# Patient Record
Sex: Female | Born: 1985 | Race: Black or African American | Hispanic: No | Marital: Single | Smoking: Former smoker
Health system: Southern US, Community
[De-identification: ages and names within clinical notes are randomized; demographics above are authoritative.]

## PROBLEM LIST (undated history)

## (undated) DIAGNOSIS — Z9289 Personal history of other medical treatment: Secondary | ICD-10-CM

## (undated) DIAGNOSIS — R5383 Other fatigue: Secondary | ICD-10-CM

## (undated) DIAGNOSIS — F329 Major depressive disorder, single episode, unspecified: Secondary | ICD-10-CM

## (undated) DIAGNOSIS — F419 Anxiety disorder, unspecified: Secondary | ICD-10-CM

## (undated) DIAGNOSIS — F32A Depression, unspecified: Secondary | ICD-10-CM

## (undated) HISTORY — DX: Anxiety disorder, unspecified: F41.9

## (undated) HISTORY — DX: Major depressive disorder, single episode, unspecified: F32.9

## (undated) HISTORY — DX: Depression, unspecified: F32.A

## (undated) HISTORY — PX: SPINAL FUSION: SHX223

## (undated) HISTORY — DX: Other fatigue: R53.83

---

## 2009-11-16 ENCOUNTER — Ambulatory Visit (HOSPITAL_COMMUNITY): Admission: RE | Admit: 2009-11-16 | Discharge: 2009-11-16 | Payer: Self-pay | Admitting: Psychiatry

## 2009-11-20 ENCOUNTER — Other Ambulatory Visit (HOSPITAL_COMMUNITY): Admission: RE | Admit: 2009-11-20 | Discharge: 2009-11-27 | Payer: Self-pay | Admitting: Psychiatry

## 2009-11-23 ENCOUNTER — Ambulatory Visit: Payer: Self-pay | Admitting: Psychiatry

## 2010-10-22 ENCOUNTER — Ambulatory Visit (HOSPITAL_COMMUNITY)
Admission: RE | Admit: 2010-10-22 | Discharge: 2010-10-22 | Disposition: A | Discharge status: Home / Self Care | Payer: BC Managed Care – PPO | Attending: Psychiatry | Admitting: Psychiatry

## 2010-10-22 ENCOUNTER — Ambulatory Visit (HOSPITAL_COMMUNITY): Admission: RE | Admit: 2010-10-22 | Payer: Self-pay | Source: Home / Self Care | Admitting: Psychiatry

## 2010-10-22 DIAGNOSIS — F331 Major depressive disorder, recurrent, moderate: Secondary | ICD-10-CM | POA: Insufficient documentation

## 2010-10-24 ENCOUNTER — Other Ambulatory Visit (HOSPITAL_COMMUNITY): Payer: BC Managed Care – PPO | Admitting: Psychiatry

## 2010-10-25 ENCOUNTER — Other Ambulatory Visit (HOSPITAL_COMMUNITY): Payer: BC Managed Care – PPO | Attending: Psychiatry | Admitting: Psychiatry

## 2010-10-25 DIAGNOSIS — F39 Unspecified mood [affective] disorder: Secondary | ICD-10-CM

## 2010-10-25 DIAGNOSIS — Z818 Family history of other mental and behavioral disorders: Secondary | ICD-10-CM | POA: Insufficient documentation

## 2010-10-25 DIAGNOSIS — F411 Generalized anxiety disorder: Secondary | ICD-10-CM | POA: Insufficient documentation

## 2010-10-26 ENCOUNTER — Other Ambulatory Visit (HOSPITAL_COMMUNITY): Payer: BC Managed Care – PPO | Admitting: Psychiatry

## 2010-10-29 ENCOUNTER — Other Ambulatory Visit (HOSPITAL_COMMUNITY): Payer: BC Managed Care – PPO | Admitting: Psychiatry

## 2010-10-30 ENCOUNTER — Other Ambulatory Visit (HOSPITAL_COMMUNITY): Payer: BC Managed Care – PPO | Admitting: Psychiatry

## 2010-10-31 ENCOUNTER — Other Ambulatory Visit (HOSPITAL_COMMUNITY): Payer: BC Managed Care – PPO | Admitting: Psychiatry

## 2010-11-01 ENCOUNTER — Other Ambulatory Visit (HOSPITAL_COMMUNITY): Payer: BC Managed Care – PPO | Admitting: Psychiatry

## 2010-11-02 ENCOUNTER — Other Ambulatory Visit (HOSPITAL_COMMUNITY): Payer: BC Managed Care – PPO | Admitting: Psychiatry

## 2010-11-05 ENCOUNTER — Other Ambulatory Visit (HOSPITAL_COMMUNITY): Payer: BC Managed Care – PPO | Admitting: Psychiatry

## 2010-11-06 ENCOUNTER — Other Ambulatory Visit (HOSPITAL_COMMUNITY): Payer: BC Managed Care – PPO | Admitting: Psychiatry

## 2010-11-07 ENCOUNTER — Other Ambulatory Visit (HOSPITAL_COMMUNITY): Payer: BC Managed Care – PPO | Admitting: Psychiatry

## 2010-11-08 ENCOUNTER — Other Ambulatory Visit (HOSPITAL_COMMUNITY): Payer: BC Managed Care – PPO | Admitting: Psychiatry

## 2010-11-09 ENCOUNTER — Other Ambulatory Visit (HOSPITAL_COMMUNITY): Payer: BC Managed Care – PPO | Admitting: Psychiatry

## 2010-11-12 ENCOUNTER — Other Ambulatory Visit (HOSPITAL_COMMUNITY): Payer: BC Managed Care – PPO | Admitting: Psychiatry

## 2010-11-13 ENCOUNTER — Other Ambulatory Visit (HOSPITAL_COMMUNITY): Payer: BC Managed Care – PPO | Admitting: Psychiatry

## 2011-07-26 ENCOUNTER — Encounter: Payer: Self-pay | Admitting: *Deleted

## 2011-07-26 ENCOUNTER — Emergency Department (HOSPITAL_COMMUNITY)
Admission: EM | Admit: 2011-07-26 | Discharge: 2011-07-26 | Disposition: A | Discharge status: Home / Self Care | Payer: No Typology Code available for payment source | Attending: Emergency Medicine | Admitting: Emergency Medicine

## 2011-07-26 ENCOUNTER — Emergency Department (HOSPITAL_COMMUNITY): Payer: No Typology Code available for payment source

## 2011-07-26 DIAGNOSIS — M546 Pain in thoracic spine: Secondary | ICD-10-CM | POA: Insufficient documentation

## 2011-07-26 DIAGNOSIS — M542 Cervicalgia: Secondary | ICD-10-CM | POA: Insufficient documentation

## 2011-07-26 DIAGNOSIS — M25519 Pain in unspecified shoulder: Secondary | ICD-10-CM | POA: Insufficient documentation

## 2011-07-26 DIAGNOSIS — Z981 Arthrodesis status: Secondary | ICD-10-CM | POA: Insufficient documentation

## 2011-07-26 DIAGNOSIS — M549 Dorsalgia, unspecified: Secondary | ICD-10-CM

## 2011-07-26 DIAGNOSIS — IMO0001 Reserved for inherently not codable concepts without codable children: Secondary | ICD-10-CM | POA: Insufficient documentation

## 2011-07-26 DIAGNOSIS — Z79899 Other long term (current) drug therapy: Secondary | ICD-10-CM | POA: Insufficient documentation

## 2011-07-26 DIAGNOSIS — M545 Low back pain, unspecified: Secondary | ICD-10-CM | POA: Insufficient documentation

## 2011-07-26 DIAGNOSIS — F17200 Nicotine dependence, unspecified, uncomplicated: Secondary | ICD-10-CM | POA: Insufficient documentation

## 2011-07-26 MED ORDER — OXYCODONE-ACETAMINOPHEN 5-325 MG PO TABS: 1.0000 | ORAL_TABLET | Freq: Once | ORAL | Status: AC

## 2011-07-26 MED ORDER — DIAZEPAM 5 MG PO TABS: 5.0000 mg | ORAL_TABLET | Freq: Once | ORAL | Status: AC

## 2011-07-26 MED ORDER — IBUPROFEN 600 MG PO TABS: 600.0000 mg | ORAL_TABLET | Freq: Once | ORAL | Status: AC

## 2011-07-26 MED ORDER — OXYCODONE-ACETAMINOPHEN 5-325 MG PO TABS
1.0000 | ORAL_TABLET | Freq: Once | ORAL | Status: AC
  Administered 2011-07-26: 1 via ORAL
  Filled 2011-07-26: qty 1

## 2011-07-26 MED ORDER — IBUPROFEN 200 MG PO TABS
600.0000 mg | ORAL_TABLET | Freq: Once | ORAL | Status: AC
  Administered 2011-07-26: 600 mg via ORAL
  Filled 2011-07-26: qty 3

## 2011-07-26 MED ORDER — DIAZEPAM 5 MG PO TABS
5.0000 mg | ORAL_TABLET | Freq: Once | ORAL | Status: AC
  Administered 2011-07-26: 5 mg via ORAL
  Filled 2011-07-26: qty 1
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
## 2011-07-26 NOTE — ED Provider Notes (Signed)
Medical screening examination/treatment/procedure(s) were performed by non-physician practitioner and as supervising physician I was immediately available for consultation/collaboration.  Pansey Pinheiro, MD 07/26/11 2300 

## 2011-08-08 ENCOUNTER — Emergency Department (HOSPITAL_COMMUNITY): Payer: No Typology Code available for payment source

## 2011-08-08 ENCOUNTER — Emergency Department (HOSPITAL_COMMUNITY)
Admission: EM | Admit: 2011-08-08 | Discharge: 2011-08-08 | Disposition: A | Discharge status: Home / Self Care | Payer: No Typology Code available for payment source | Attending: Emergency Medicine | Admitting: Emergency Medicine

## 2011-08-08 DIAGNOSIS — M25519 Pain in unspecified shoulder: Secondary | ICD-10-CM | POA: Insufficient documentation

## 2011-08-08 DIAGNOSIS — Z79899 Other long term (current) drug therapy: Secondary | ICD-10-CM | POA: Insufficient documentation

## 2011-08-08 DIAGNOSIS — R209 Unspecified disturbances of skin sensation: Secondary | ICD-10-CM | POA: Insufficient documentation

## 2011-08-08 DIAGNOSIS — F17200 Nicotine dependence, unspecified, uncomplicated: Secondary | ICD-10-CM | POA: Insufficient documentation

## 2011-08-08 DIAGNOSIS — IMO0001 Reserved for inherently not codable concepts without codable children: Secondary | ICD-10-CM | POA: Insufficient documentation

## 2011-08-08 DIAGNOSIS — M549 Dorsalgia, unspecified: Secondary | ICD-10-CM | POA: Insufficient documentation

## 2011-08-08 DIAGNOSIS — M62838 Other muscle spasm: Secondary | ICD-10-CM | POA: Insufficient documentation

## 2011-08-08 MED ORDER — HYDROCODONE-ACETAMINOPHEN 5-325 MG PO TABS: 1.0000 | ORAL_TABLET | Freq: Four times a day (QID) | ORAL | Status: AC | PRN

## 2011-08-08 MED ORDER — DIAZEPAM 5 MG PO TABS: 5.0000 mg | ORAL_TABLET | Freq: Two times a day (BID) | ORAL | Status: AC

## 2011-08-08 MED ORDER — IBUPROFEN 800 MG PO TABS: 800.0000 mg | ORAL_TABLET | Freq: Three times a day (TID) | ORAL | Status: AC
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
## 2011-08-08 NOTE — ED Provider Notes (Signed)
Medical screening examination/treatment/procedure(s) were performed by non-physician practitioner and as supervising physician I was immediately available for consultation/collaboration.   Laray Anger, DO 08/08/11 1948
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
## 2011-08-22 ENCOUNTER — Ambulatory Visit (HOSPITAL_COMMUNITY)
Admission: RE | Admit: 2011-08-22 | Discharge: 2011-08-22 | Disposition: A | Discharge status: Home / Self Care | Payer: BC Managed Care – PPO | Attending: Psychiatry | Admitting: Psychiatry

## 2011-08-22 DIAGNOSIS — F332 Major depressive disorder, recurrent severe without psychotic features: Secondary | ICD-10-CM | POA: Insufficient documentation

## 2011-08-22 DIAGNOSIS — F17200 Nicotine dependence, unspecified, uncomplicated: Secondary | ICD-10-CM | POA: Insufficient documentation

## 2011-08-22 NOTE — BH Assessment (Signed)
Assessment Note   Peggy Richardson is an 26 y.o. female who presents with symptoms of severe depression and anxiety.  Pt states that her sister woke her up last week and stated she no longer wanted to live and was ready to die.  Pt stated her sister did not attempt suicide but that she is up with her constantly and is always worried about her.  Pt states that due to her sisters condition the pt is constantly panic stricken and deeply depressed.  Pt suffers from at least 1 panic attack per day and states that her symptoms are interfering with her job and family life.  Pt is concerned that her sisters condition is tearing her family apart and this concern is contributing to her MH symptoms. Pt stated her sister refuses to seek help.  Pt states that she has been in Psych IOP before in April of 2012.  Pt states that psych IOP helped her a great deal.  Pt denies any SI and HI as well as A/V hallucinations and delusions.  Pt was able to contract for safety.  Pt referred to [facility].      Axis I: Major Depression, Recurrent severe Axis II: Deferred Axis III: No past medical history on file. Axis IV: housing problems, other psychosocial or environmental problems, problems related to social environment and problems with primary support group Axis V: 31-40 impairment in reality testing  Past Medical History: No past medical history on file.  Past Surgical History  Procedure Date  . Spinal fusion     Family History: No family history on file.  Social History:  reports that she has been smoking Cigarettes.  She does not have any smokeless tobacco history on file. She reports that she drinks alcohol. She reports that she does not use illicit drugs.  Additional Social History:    Allergies: No Known Allergies  Home Medications:  Medications Prior to Admission  Medication Sig Dispense Refill  . ALPRAZolam (XANAX) 1 MG tablet Take 1 mg by mouth 3 (three) times daily as needed. anxiety       . ARIPiprazole  (ABILIFY PO) Take 1 tablet by mouth daily.       . diazepam (VALIUM) 5 MG tablet Take 5 mg by mouth every 6 (six) hours as needed.      Marland Kitchen ibuprofen (ADVIL,MOTRIN) 600 MG tablet Take 600 mg by mouth every 6 (six) hours as needed. pain      . imipramine (TOFRANIL) 50 MG tablet Take 150 mg by mouth at bedtime.        Marland Kitchen oxyCODONE-acetaminophen (PERCOCET) 5-325 MG per tablet Take 1 tablet by mouth every 4 (four) hours as needed.       No current facility-administered medications on file as of 08/22/2011.    OB/GYN Status:  Patient's last menstrual period was 07/02/2011.  General Assessment Data Location of Assessment: [facility] Assessment Services Living Arrangements: Relatives (sister ) Can pt return to current living arrangement?: Yes Admission Status: Other (Comment) Is patient capable of signing voluntary admission?: Yes Transfer from: Home Referral Source: Self/Family/Friend     Risk to self Suicidal Ideation: No Suicidal Intent: No Is patient at risk for suicide?: No Suicidal Plan?: No Access to Means: No What has been your use of drugs/alcohol within the last 12 months?: none Previous Attempts/Gestures: No How many times?: 0  Other Self Harm Risks: used to cut self Triggers for Past Attempts: None known Intentional Self Injurious Behavior: Cutting Family Suicide History: No Recent stressful  life event(s): Turmoil (Comment);Trauma (Comment) (sister is suicidal and depressed) Persecutory voices/beliefs?: No Depression: Yes Depression Symptoms: Despondent;Insomnia;Tearfulness;Isolating;Guilt;Fatigue;Loss of interest in usual pleasures;Feeling worthless/self pity;Feeling angry/irritable Substance abuse history and/or treatment for substance abuse?: No Suicide prevention information given to non-admitted patients: Not applicable  Risk to Others Homicidal Ideation: No Thoughts of Harm to Others: No Current Homicidal Intent: No Current Homicidal Plan: No Access to Homicidal Means:  No Identified Victim: none History of harm to others?: No Assessment of Violence: None Noted Violent Behavior Description: used to cut herself Does patient have access to weapons?: No Criminal Charges Pending?: No Does patient have a court date: No  Psychosis Hallucinations: None noted Delusions: None noted  Mental Status Report Appear/Hygiene: Other (Comment) (unremarkable) Eye Contact: Good Motor Activity: Unremarkable Speech: Other (Comment) (unremarkable) Level of Consciousness: Alert Mood: Depressed;Despair;Sad Affect: Anxious;Sad;Depressed Anxiety Level: Panic Attacks Panic attack frequency: daily Most recent panic attack: yesterday Thought Processes: Coherent;Relevant Judgement: Unimpaired Orientation: Person;Place;Time;Situation Obsessive Compulsive Thoughts/Behaviors: Moderate  Cognitive Functioning Concentration: Decreased Memory: Recent Impaired;Remote Intact IQ: Average Insight: Fair Impulse Control: Good Appetite: Poor Weight Loss: 0  Weight Gain: 0  Sleep: Decreased Total Hours of Sleep: 4  Vegetative Symptoms: None  Prior Inpatient Therapy Prior Inpatient Therapy: No Prior Therapy Dates: none Prior Therapy Facilty/Provider(s): none Reason for Treatment: none  Prior Outpatient Therapy Prior Outpatient Therapy: Yes Prior Therapy Dates: april 2012 Prior Therapy Facilty/Provider(s): [facility] Reason for Treatment: depression and anxiety                     Additional Information 1:1 In Past 12 Months?: No CIRT Risk: No Elopement Risk: No Does patient have medical clearance?: No     Disposition:  Disposition Disposition of Patient: Outpatient treatment Type of outpatient treatment: Psych Intensive Outpatient  On Site Evaluation by:   Reviewed with Physician:     Ena Dawley Pate 08/22/2011 12:04 PM

## 2011-08-26 ENCOUNTER — Encounter (HOSPITAL_COMMUNITY): Payer: Self-pay

## 2011-08-26 ENCOUNTER — Other Ambulatory Visit (HOSPITAL_COMMUNITY): Payer: BC Managed Care – PPO | Attending: Psychiatry

## 2011-08-26 DIAGNOSIS — F332 Major depressive disorder, recurrent severe without psychotic features: Secondary | ICD-10-CM | POA: Insufficient documentation

## 2011-08-26 DIAGNOSIS — Z818 Family history of other mental and behavioral disorders: Secondary | ICD-10-CM | POA: Insufficient documentation

## 2011-08-26 DIAGNOSIS — F411 Generalized anxiety disorder: Secondary | ICD-10-CM | POA: Insufficient documentation

## 2011-08-26 DIAGNOSIS — Z79899 Other long term (current) drug therapy: Secondary | ICD-10-CM | POA: Insufficient documentation

## 2011-08-26 DIAGNOSIS — F339 Major depressive disorder, recurrent, unspecified: Secondary | ICD-10-CM

## 2011-08-26 DIAGNOSIS — F17200 Nicotine dependence, unspecified, uncomplicated: Secondary | ICD-10-CM | POA: Insufficient documentation

## 2011-08-26 NOTE — Progress Notes (Signed)
   Daily Group Progress Note  Program: IOP  Group Time: 0900 - 1045  Participation Level: Active  Behavioral Response: Appropriate, Sharing and Motivated  Type of Therapy:  Process Group  Summary of Progress: New to group, but had been in IOP in the past, so we did introductions and little other orientation needed.  Participated in discussion of what constituted "working through" problems in therapy.  She was most appropriate and although blunted in affect, contributed to the group.    Group Time: 1100 - 1200  Participation Level:    See paper progress notes  Behavioral Response: See paper progress notes  Type of Therapy:  See paper progress notes  Summary of Progress:  Shonna Chock, APRN, CNS  [facility]
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
## 2011-08-26 NOTE — Progress Notes (Signed)
     Daily Group Progress Note  Program: IOP  Group Time: 0900 - 1045  Participation Level: Active  Behavioral Response: Appropriate, Sharing and Motivated  Type of Therapy:  Process Group  Summary of Progress: New to group near end of this group.  Began to contribute with discussion related to what it means to "work through" problems in therapy.  Themes of group fit nicely with her symptoms as all are dealing with anxiety and depression.    Group Time: 1100 - 1200  Participation Level:    See paper progress notes  Behavioral Response: See paper progress notes  Type of Therapy:  See paper progress notes  Summary of Progress:  Marsella Suman, APRN, CNS  [facility] 

## 2011-08-26 NOTE — Progress Notes (Signed)
     Daily Group Progress Note  Program: IOP  Group Time: 0900 - 1045  Participation Level: Active  Behavioral Response: Appropriate, Sharing and Motivated  Type of Therapy:  Process Group  Summary of Progress: New to group near end of this group.  Began to contribute with discussion related to what it means to "work through" problems in therapy.  Themes of group fit nicely with her symptoms as all are dealing with anxiety and depression.    Group Time: 1100 - 1200  Participation Level:    See paper progress notes  Behavioral Response: See paper progress notes  Type of Therapy:  See paper progress notes  Summary of Progress:  Shonna Chock, APRN, CNS  [facility]

## 2011-08-26 NOTE — Progress Notes (Unsigned)
Psychiatric Assessment Adult  Patient Identification:  Demari Kropp Date of Evaluation:  08/26/2011 Chief Complaint: *** History of Chief Complaint:   Chief Complaint  Patient presents with  . Anxiety  . Depression  . Stress    HPI Review of Systems Physical Exam  Depressive Symptoms: {DEPRESSION SYMPTOMS:20000}  (Hypo) Manic Symptoms:   Elevated Mood:  {BHH YES OR NO:22294} Irritable Mood:  {BHH YES OR NO:22294} Grandiosity:  {BHH YES OR NO:22294} Distractibility:  {BHH YES OR NO:22294} Labiality of Mood:  {BHH YES OR NO:22294} Delusions:  {BHH YES OR NO:22294} Hallucinations:  {BHH YES OR NO:22294} Impulsivity:  {BHH YES OR NO:22294} Sexually Inappropriate Behavior:  {BHH YES OR NO:22294} Financial Extravagance:  {BHH YES OR NO:22294} Flight of Ideas:  {BHH YES OR NO:22294}  Anxiety Symptoms: Excessive Worry:  {BHH YES OR NO:22294} Panic Symptoms:  {BHH YES OR NO:22294} Agoraphobia:  {BHH YES OR NO:22294} Obsessive Compulsive: {BHH YES OR NO:22294}  Symptoms: {Obsessive Compulsive Symptoms:22671} Specific Phobias:  {BHH YES OR NO:22294} Social Anxiety:  {BHH YES OR NO:22294}  Psychotic Symptoms:  Hallucinations: {BHH YES OR NO:22294} {Hallucinations:22672} Delusions:  {BHH YES OR NO:22294} Paranoia:  {BHH YES OR NO:22294}   Ideas of Reference:  {BHH YES OR NO:22294}  PTSD Symptoms: Ever had a traumatic exposure:  {BHH YES OR NO:22294} Had a traumatic exposure in the last month:  {BHH YES OR NO:22294} Re-experiencing: {BHH YES OR NO:22294} {Re-experiencing:22673} Hypervigilance:  {BHH YES OR NO:22294} Hyperarousal: {BHH YES OR NO:22294} {Hyperarousal:22674} Avoidance: {BHH YES OR NO:22294} {Avoidance:22675}  Traumatic Brain Injury: {BHH YES OR NO:22294} {Traumatic Brain Injury:22676}  Past Psychiatric History: Diagnosis: ***  Hospitalizations: ***  Outpatient Care: ***  Substance Abuse Care: ***  Self-Mutilation: ***  Suicidal Attempts: ***  Violent  Behaviors: ***   Past Medical History:   Past Medical History  Diagnosis Date  . Anxiety   . Depression   . Fatigue    History of Loss of Consciousness:  {BHH YES OR NO:22294} Seizure History:  {BHH YES OR NO:22294} Cardiac History:  {BHH YES OR NO:22294} Allergies:  No Known Allergies Current Medications:  Current Outpatient Prescriptions  Medication Sig Dispense Refill  . ALPRAZolam (XANAX) 1 MG tablet Take 1 mg by mouth 3 (three) times daily as needed. anxiety       . ARIPiprazole (ABILIFY PO) Take 1 tablet by mouth daily.       Marland Kitchen imipramine (TOFRANIL) 50 MG tablet Take 150 mg by mouth at bedtime.        . diazepam (VALIUM) 5 MG tablet Take 5 mg by mouth every 6 (six) hours as needed.      Marland Kitchen ibuprofen (ADVIL,MOTRIN) 600 MG tablet Take 600 mg by mouth every 6 (six) hours as needed. pain      . oxyCODONE-acetaminophen (PERCOCET) 5-325 MG per tablet Take 1 tablet by mouth every 4 (four) hours as needed.        Previous Psychotropic Medications:  Medication Dose   ***  ***                     Substance Abuse History in the last 12 months: Substance Age of 1st Use Last Use Amount Specific Type  Nicotine  ***  ***  ***  ***  Alcohol  ***  ***  ***  ***  Cannabis  ***  ***  ***  ***  Opiates  ***  ***  ***  ***  Cocaine  ***  ***  ***  ***  Methamphetamines  ***  ***  ***  ***  LSD  ***  ***  ***  ***  Ecstasy  ***   ***  ***  ***  Benzodiazepines  ***  ***  ***  ***  Caffeine  ***  ***  ***  ***  Inhalants  ***  ***  ***  ***  Others:                          Medical Consequences of Substance Abuse: ***  Legal Consequences of Substance Abuse: ***  Family Consequences of Substance Abuse: ***  Blackouts:  {BHH YES OR NO:22294} DT's:  {BHH YES OR NO:22294} Withdrawal Symptoms:  {BHH YES OR NO:22294} {Withdrawal Symptoms:22677}  Social History: Current Place of Residence: *** Place of Birth: *** Family Members: *** Marital Status:  {Marital  Status:22678} Children: ***  Sons: ***  Daughters: *** Relationships: *** Education:  {Education:22679} Educational Problems/Performance: *** Religious Beliefs/Practices: *** History of Abuse: {Desc; abuse:16542} Occupational Experiences; Military History:  {Military History:22680} Legal History: *** Hobbies/Interests: ***  Family History:   Family History  Problem Relation Age of Onset  . Depression Sister     Mental Status Examination/Evaluation: Objective:  Appearance: {Appearance:22683}  Eye Contact::  {BHH EYE CONTACT:22684}  Speech:  {Speech:22685}  Volume:  {Volume (PAA):22686}  Mood:  ***  Affect:  {Affect (PAA):22687}  Thought Process:  {Thought Process (PAA):22688}  Orientation:  {BHH ORIENTATION (PAA):22689}  Thought Content:  {Thought Content:22690}  Suicidal Thoughts:  {ST/HT (PAA):22692}  Homicidal Thoughts:  {ST/HT (PAA):22692}  Judgement:  {Judgement (PAA):22694}  Insight:  {Insight (PAA):22695}  Psychomotor Activity:  {Psychomotor (PAA):22696}  Akathisia:  {BHH YES OR NO:22294}  Handed:  {Handed:22697}  AIMS (if indicated):  ***  Assets:  {Assets (PAA):22698}    Laboratory/X-Ray Psychological Evaluation(s)   ***  ***   Assessment:  {axis diagnosis:3049000}  AXIS I {psych axis 1:31909}  AXIS II {psych axis 2:31910}  AXIS III Past Medical History  Diagnosis Date  . Anxiety   . Depression   . Fatigue      AXIS IV {psych axis iv:31915}  AXIS V {psych axis v score:31919}   Treatment Plan/Recommendations:  Plan of Care: ***  Laboratory:  {Laboratory:22682}  Psychotherapy: ***  Medications: ***  Routine PRN Medications:  {BHH YES OR NO:22294}  Consultations: ***  Safety Concerns:  ***  Other:      [facility] 2/4/20131:59 PM

## 2011-08-27 ENCOUNTER — Telehealth (HOSPITAL_COMMUNITY): Payer: Self-pay | Admitting: Psychiatry

## 2011-08-27 ENCOUNTER — Other Ambulatory Visit (HOSPITAL_COMMUNITY): Payer: BC Managed Care – PPO

## 2011-08-27 NOTE — Progress Notes (Unsigned)
    Daily Group Progress Note  Program: IOP  Group Time:  9:00 - 10:30 a.m.  Participation Level: None  Behavioral Response:   Type of Therapy:  Process Group  Summary of Progress:  Patient cancelled group today.  Group Time:  11:00 - Noon  Participation Level:    Behavioral Response:   Type of Therapy: Psycho-education Group  Summary of Progress:  Patient participated in a group on types of communication including assertive communication.  Was not present.  Cleophas Dunker, LMFT, CTS

## 2011-08-28 ENCOUNTER — Other Ambulatory Visit (HOSPITAL_COMMUNITY): Payer: BC Managed Care – PPO

## 2011-08-28 ENCOUNTER — Telehealth (HOSPITAL_COMMUNITY): Payer: Self-pay | Admitting: Psychiatry

## 2011-08-28 NOTE — Progress Notes (Unsigned)
    Daily Group Progress Note  Program: IOP  Group Time:  9:00 - 10:30 a.m.  Participation Level: {CHL AMB BH Group Participation:21022742}  Behavioral Response: {CHL AMB BH Group Behavior:21022743}  Type of Therapy:  Process Group  Summary of Progress:  Patient called stating she could not get her car started.  Group Time:  11:00 - Noon  Participation Level:  {CHL AMB BH Group Participation:21022742}  Behavioral Response: {CHL AMB BH Group Behavior:21022743}  Type of Therapy: Psycho-education Group  Summary of Progress:    Cleophas Dunker, LMFT, CTS

## 2011-08-28 NOTE — Progress Notes (Unsigned)
Psychiatric Assessment Adult  Patient Identification:  Awanda Mink Date of Evaluation:  08-26-11 Chief Complaint: Depression and anxiety History of Chief Complaint:  26 year old single African American female admitted because of depression. She lives with her sister her aunt this has been very stressful as sister has been depressed and has expressed suicidal ideation. Patient also works for city [employer] and worked as very stressful.  Chief Complaint  Patient presents with  . Depression    HPI Review of Systems Physical Exam  Depressive Symptoms: depressed mood, anhedonia, insomnia, psychomotor retardation, fatigue, feelings of worthlessness/guilt, difficulty concentrating, hopelessness, impaired memory, anxiety,  (Hypo) Manic Symptoms:   Elevated Mood:  No Irritable Mood:  No Grandiosity:  No Distractibility:  Yes Labiality of Mood:  No Delusions:  No Hallucinations:  No Impulsivity:  No Sexually Inappropriate Behavior:  No Financial Extravagance:  No Flight of Ideas:  No  Anxiety Symptoms: Excessive Worry:  Yes Panic Symptoms:  No Agoraphobia:  No Obsessive Compulsive: No  Symptoms: None, Specific Phobias:  No Social Anxiety:  Yes  Psychotic Symptoms:  Hallucinations: No None Delusions:  No Paranoia:  No    Ideas of Reference:  No  PTSD Symptoms: Ever had a traumatic exposure:  No Had a traumatic exposure in the last month:  No Re-experiencing: No None Hypervigilance:  No Hyperarousal: No None Avoidance: No None  Traumatic Brain Injury: No none  Past Psychiatric History: Diagnosis: Depression   Hospitalizations: [facility] in April of 2012   Outpatient Care: Dr. Evelene Croon  Substance Abuse Care: None   Self-Mutilation: History of cutting   Suicidal Attempts: None   Violent Behaviors: None    Past Medical History:   Past Medical History  Diagnosis Date  . Anxiety   . Depression   . Fatigue    History of Loss of Consciousness:  No Seizure  History:  No Cardiac History:  No Allergies:  No Known Allergies Current Medications:  Current Outpatient Prescriptions  Medication Sig Dispense Refill  . ALPRAZolam (XANAX) 1 MG tablet Take 1 mg by mouth 3 (three) times daily as needed. anxiety       . ARIPiprazole (ABILIFY PO) Take 1 tablet by mouth daily.       . diazepam (VALIUM) 5 MG tablet Take 5 mg by mouth every 6 (six) hours as needed.      Marland Kitchen ibuprofen (ADVIL,MOTRIN) 600 MG tablet Take 600 mg by mouth every 6 (six) hours as needed. pain      . imipramine (TOFRANIL) 50 MG tablet Take 150 mg by mouth at bedtime.        Marland Kitchen oxyCODONE-acetaminophen (PERCOCET) 5-325 MG per tablet Take 1 tablet by mouth every 4 (four) hours as needed.        Previous Psychotropic Medications:  Medication Dose                          Substance Abuse History in the last 12 months: Substance Age of 1st Use Last Use Amount Specific Type  Nicotine  teenager   today 4 cigarettes per day     Alcohol  teenager   uses it socially last use unknown     Cannabis      Opiates      Cocaine      Methamphetamines      LSD      Ecstasy      Benzodiazepines      Caffeine  Inhalants      Others:                          Medical Consequences of Substance Abuse:   Legal Consequences of Substance Abuse:   Family Consequences of Substance Abuse:   Blackouts:  No DT's:  No Withdrawal Symptoms:  No None  Social History: Current Place of Residence[place] Birth: Family Members:  Marital Status:  Single Children:   Sons:   Daughters:  Relationships:  Education:  HS Print production planner Problems/Performance: Religious Beliefs/Practices:  History of Abuse: none Teacher, music History:  None Legal History: None Hobbies/Interests:   Family History:   Family History  Problem Relation Age of Onset  . Depression Sister     Mental Status Examination/Evaluation: Objective:  Appearance: Casual obese  young lady   Eye Contact::  Good  Speech:  Clear and Coherent  Volume:  Normal  Mood:  Depressed anxious   Affect:  Appropriate  Thought Process:  Logical  Orientation:  Full  Thought Content:  WDL  Suicidal Thoughts:  No  Homicidal Thoughts:  No  Judgement:  Fair  Insight:  Fair  Psychomotor Activity:  Normal  Akathisia:  No  Handed:  Right  AIMS (if indicated):    Assets:  Communication Skills Desire for Improvement Physical Health Resilience    Laboratory/X-Ray Psychological Evaluation(s)        Assessment:  Axis I: Major Depression, Recurrent severe                                    AXIS I Anxiety Disorder NOS  AXIS II Deferred  AXIS III Past Medical History  Diagnosis Date  . Anxiety   . Depression   . Fatigue      AXIS IV economic problems, other psychosocial or environmental problems, problems related to social environment and problems with primary support group  AXIS V 51-60 moderate symptoms   Treatment Plan/Recommendations:  Plan of Care: Start IOP   Laboratory:  None  Psychotherapy: Individual, group   Medications: Continue home medications of imipramine 150 mg by mouth daily at bedtime #2 Xanax 1 mg at bedtime, #3 Abilify 5 mg by mouth Q. at bedtime   Routine PRN Medications:  Yes  Consultations: None   Safety Concerns:  None   Other:    Zakery Normington  [facility] 2/6/201310:10 AM

## 2011-08-29 ENCOUNTER — Other Ambulatory Visit (HOSPITAL_COMMUNITY): Payer: BC Managed Care – PPO

## 2011-08-29 NOTE — Progress Notes (Unsigned)
    Daily Group Progress Note  Program: {CHL AMB BH IOP/CDIOP Program Type:21022744}  Group Time:0900-1030   Participation Level: Active  Behavioral Response: Sharing, Appropriate  Type of Therapy:  Process Group  Summary of Progress: ***per Cleophas Dunker, LMFT, pt shared with the group why she was here in [facility]-IOP again.  Mentioned stressors of job, finances and sister's illness.     Group Time:   Participation Level:  {CHL AMB BH Group Participation:21022742}  Behavioral Response: {CHL AMB BH Group Behavior:21022743}  Type of Therapy: {CHL AMB BH Type of Therapy:21022741}  Summary of Progress: ***  [facility]

## 2011-08-30 ENCOUNTER — Other Ambulatory Visit (HOSPITAL_COMMUNITY): Payer: BC Managed Care – PPO

## 2011-08-30 DIAGNOSIS — F331 Major depressive disorder, recurrent, moderate: Secondary | ICD-10-CM

## 2011-08-30 NOTE — Progress Notes (Signed)
    Daily Group Progress Note  Program: IOP  Group Time: 0900 - 1030  Participation Level: Active  Behavioral Response: Appropriate, Sharing and Motivated  Type of Therapy:  Process Group  Summary of Progress: Leatrice reports she is feeling good and strong today.  She talked about how assertive she was with her sister and mother, which resulted in plan for sister to return home today to stay for awhile.  Annastyn will drive her and spend the weekend, but says she is going to reclaim her living space and refuse to have sister live with her again.  She confided that she was having the same problem a year ago when she attended IOP for the first time, but only now got the courage to speak up for herself.  She then participated much more spontaneously in the group and received a lot of positive feedback.     Group Time: 1045 - 1200  Participation Level:  Active  Behavioral Response: Appropriate, Sharing and Motivated  Type of Therapy: Psycho-education Group  Summary of Progress:  In preparing for the coming weekend, we did additional work on coping with anxiety.  In addition, we began preparing for discharge by looking at the therapeutic factors of being in this group.  Then we talked about ways to find some of those same factors elsewhere in out lives.  Some suggestions included support groups, classes or groups related to a topic of interest, making more friends who share our interests, and giving up the idea that everyone else is doing great and beginning to open up to more people.  Shonna Chock, APRN, CNS    [facility]

## 2011-09-02 ENCOUNTER — Other Ambulatory Visit (HOSPITAL_COMMUNITY): Payer: BC Managed Care – PPO

## 2011-09-02 DIAGNOSIS — F332 Major depressive disorder, recurrent severe without psychotic features: Secondary | ICD-10-CM

## 2011-09-02 NOTE — Progress Notes (Signed)
  Daily Group Progress Note  Program: IOP  Group Time: 0900 - 1030  Participation Level: Active  Behavioral Response: Appropriate, Sharing and Motivated  Type of Therapy:  Process Group  Summary of Progress: Peggy Richardson demonstrated low energy, and reported she drove back here from [place] (5 hours) just this morning in time for group.  She did take her sister home to their mother's and leave her there, but is already missing her.  She processed her plans related to reclaiming her apartment for herself and working toward return to college in order to prepare for a different job.  She recognized her ambivalence and clarified the reasons it was difficult to have the financial responsibility for her sister when she can barely support herself.  She is also considering quitting her job and moving back home, but is giving this another month before deciding.  Group Time: 1045 - 1200  Participation Level:  Active  Behavioral Response: Appropriate, Sharing and Motivated  Type of Therapy: Psycho-education Group  Summary of Progress: Grief and Loss group led by Theda Belfast, hospital Chaplain  Explores variety of losses and coping skills for dealing with these.    Shonna Chock, APRN, CNS  [facility]

## 2011-09-03 ENCOUNTER — Other Ambulatory Visit (HOSPITAL_COMMUNITY): Payer: BC Managed Care – PPO

## 2011-09-03 NOTE — Progress Notes (Unsigned)
    Daily Group Progress Note  Program: IOP  Group Time: 9:00-10:30 am   Participation Level: Active  Behavioral Response: Appropriate, Sharing, Motivated and Assertive  Type of Therapy:  Process Group  Summary of Progress: Patient reports improved mood and reduced depression as she feels good about her decision to have her sister move out of her home and is learning how to set better boundaries with family members.      Group Time: 10:30 am - 12:00 pm   Participation Level:  Active  Behavioral Response: Appropriate, Sharing, Motivated and Assertive  Type of Therapy: Psycho-education Group  Summary of Progress: Patient was educated on the purpose, goals and expectations of the group therapy session. Patient shared her goals for therapy as wanting to increase hers self-esteem and learn new coping skills to manage depression.   Maxcine Ham, MSW, LCSW

## 2011-09-04 ENCOUNTER — Other Ambulatory Visit (HOSPITAL_COMMUNITY): Payer: BC Managed Care – PPO

## 2011-09-04 NOTE — Progress Notes (Unsigned)
    Daily Group Progress Note  Program: IOP  Group Time: 9:00-10:30 am   Participation Level: Active  Behavioral Response: Appropriate, Sharing, Motivated and Assertive  Type of Therapy:  Process Group  Summary of Progress: Patient reports continued depression. She described her challenge with having good self-esteem and how her tendency to take care of others causes chaos in her own life and how she is trying to change and make herself more of a priority. Patient described how she is still trying to practice healthy boundary setting with others.      Group Time: 10:30 am - 12:00 pm   Participation Level:  Active  Behavioral Response: Appropriate, Sharing, Motivated and Assertive  Type of Therapy: Psycho-education Group  Summary of Progress: ***Patient participated in a discussion on defining depression and Bipolar Disorder and identified symptoms associated with each diganosis. Patient identified personal symptoms of depression and how to recognize them going forward.  Maxcine Ham, MSW, LCSW

## 2011-09-05 ENCOUNTER — Other Ambulatory Visit (HOSPITAL_COMMUNITY): Payer: BC Managed Care – PPO

## 2011-09-06 ENCOUNTER — Other Ambulatory Visit (HOSPITAL_COMMUNITY): Payer: BC Managed Care – PPO

## 2011-09-06 NOTE — Progress Notes (Signed)
    Daily Group Progress Note  Program: IOP  Group Time: 9:00-10:30 am   Participation Level: Active  Behavioral Response: Appropriate, Sharing, Motivated and Assertive  Type of Therapy:  Process Group  Summary of Progress: Patient reports mixed emotions of improved mood and reduced depression, but increased anxiety from the financial stress in her life. Patient described the difficulty she is having paying her rent, bills and buying food while she is waiting for her check to come from her employer. Patient received support from members and was informed of ways she could access financial support in the meantime.      Group Time: 10:30 am - 12:00 pm   Participation Level:  Active  Behavioral Response: Appropriate, Sharing, Motivated and Assertive  Type of Therapy: Psycho-education Group  Summary of Progress: Patient participated in a discussion on learning self-care strategies and created a personalized list of tools she can pull from to manage stress and ensure daily self care.   Maxcine Ham, MSW, LCSW

## 2011-09-09 ENCOUNTER — Other Ambulatory Visit (HOSPITAL_COMMUNITY): Payer: BC Managed Care – PPO

## 2011-09-09 NOTE — Progress Notes (Signed)
    Daily Group Progress Note  Program: IOP  Group Time: 9:00-10:30 am   Participation Level: Active  Behavioral Response: Appropriate, Sharing, Motivated and Assertive  Type of Therapy:  Process Group  Summary of Progress: Patient reports continued improved mood, self-esteem and ability to set healthy boundaries for self. Patient described how she learning the importance of putting her needs before others in order to be healthy. Patient shared how she is learning from a previous romantic breakup the importance of not letting others take priority over self. Patient shared how she learned to be more confident in herself from this experience.      Group Time: 10:30 am - 12:00 pm   Participation Level:  Active  Behavioral Response: Appropriate  Type of Therapy: Process Group  Summary of Progress: Patient participated in a group facilitated by the hospital chaplin on grief and loss and identified current losses and identified feelings and barriers associated with grieving the loss.  Maxcine Ham, MSW, LCSW

## 2011-09-10 ENCOUNTER — Other Ambulatory Visit (HOSPITAL_COMMUNITY): Payer: BC Managed Care – PPO

## 2011-09-10 NOTE — Progress Notes (Signed)
    Daily Group Progress Note  Program: IOP  Group Time: 9:00-10:30 am   Participation Level: Active  Behavioral Response: Appropriate  Type of Therapy:  Process Group  Summary of Progress: Patient report continued stable mood and progress with self-esteem and boundary setting. Patient is scheduled to end group this Friday and states she is hoping to continue learning emotional management tools.     Group Time: 10:30 am - 12:00 pm   Participation Level:  Active  Behavioral Response: Appropriate  Type of Therapy: Psycho-education Group  Summary of Progress: Patient participated in a discussion and education segment on self-esteem, identifying factors of low self esteem and was assigned homework on how to identify positive traits in self to combat negative beliefs.   Maxcine Ham, MSW, LCSW

## 2011-09-11 ENCOUNTER — Other Ambulatory Visit (HOSPITAL_COMMUNITY): Payer: BC Managed Care – PPO

## 2011-09-11 NOTE — Progress Notes (Signed)
    Daily Group Progress Note  Program: IOP  Group Time: 9:00-10:30 am   Participation Level: Active  Behavioral Response: Appropriate  Type of Therapy:  Process Group  Summary of Progress: patient discussed triggers to depression symptoms and provided personal examples that contribute to mood instability. Patient received support from others experiencing similar triggers.      Group Time: 10:30 am - 12:00 pm   Participation Level:  Active  Behavioral Response: Appropriate  Type of Therapy: Psycho-education Group  Summary of Progress: Patient participated in an education segment on identifying triggers and creating a plan to reduce depression and anxiety symptoms in the presence of these triggers.   Jheri Mitter, MSW, LCSW  

## 2011-09-12 ENCOUNTER — Other Ambulatory Visit (HOSPITAL_COMMUNITY): Payer: BC Managed Care – PPO

## 2011-09-12 NOTE — Progress Notes (Signed)
    Daily Group Progress Note  Program: IOP  Group Time: 9:00-10:30 am   Participation Level: Active  Behavioral Response: Appropriate  Type of Therapy:  Process Group  Summary of Progress: Patient is scheduled to discharge tomorrow. Patient reports reduced depression and anxiety and  Described how setting healthy boundaries with her family has helped her to feel more in control of her life and happier. Patient processed fears associated with returning to work on Monday and identified ways to make the transition less stressful.      Group Time: 10:30 am - 12:00 pm   Participation Level:  Active  Behavioral Response: Appropriate  Type of Therapy: Psycho-education Group  Summary of Progress: Patient participated in a follow up discussion on Depression Triggers and shared personal examples from their homework assignment. Patient discussed ways to combat these triggers and received ideas from other group members.   Maxcine Ham, MSW, LCSW

## 2011-09-13 ENCOUNTER — Other Ambulatory Visit (HOSPITAL_COMMUNITY): Payer: BC Managed Care – PPO

## 2011-09-13 NOTE — Patient Instructions (Signed)
Patient completed [facility]-IOP today.  Denies any suicidal ideations.  Overall mood improving.  Will return to work on 09-16-11 without any restrictions.  Follow up appointments:  Dr. Evelene Croon 10-14-11 at 8 a.m..  Encouraged patient to call therapist Hurley Cisco, [place]) for a follow up appointment.  Encouraged support groups.

## 2011-09-13 NOTE — Progress Notes (Signed)
    Daily Group Progress Note  Program: IOP  Group Time: 9:00-10:30 am   Participation Level: Active  Behavioral Response: Appropriate  Type of Therapy:  Process Group  Summary of Progress: Patient reports feeling ready to end the group today and processed the progress she has made with managing her depression and having a better acceptance of her job situation as well as improved ability to set healthy boundaries for herself with family members and with her time.      Group Time: 10:30 am - 12:00 pm   Participation Level:  Active  Behavioral Response: Appropriate  Type of Therapy: Psycho-education Group  Summary of Progress: Patient participated in an education segment on how to plan daily to mange mental health symptoms. Patient began creating their own personalized list of must do items and discussed the importance of taking control of mental health symptoms.  Maxcine Ham, MSW, LCSW

## 2011-09-16 ENCOUNTER — Other Ambulatory Visit (HOSPITAL_COMMUNITY): Payer: BC Managed Care – PPO

## 2011-09-17 ENCOUNTER — Other Ambulatory Visit (HOSPITAL_COMMUNITY): Payer: BC Managed Care – PPO

## 2011-09-18 ENCOUNTER — Other Ambulatory Visit (HOSPITAL_COMMUNITY): Payer: BC Managed Care – PPO

## 2011-09-18 MED ORDER — ALPRAZOLAM 1 MG PO TABS: 1.0000 mg | ORAL_TABLET | Freq: Every day | ORAL | Status: DC

## 2011-09-18 NOTE — Progress Notes (Signed)
  [facility] Discharge Summary  Boyd [id number]. 09-13-11 .Discharge Note  Patient:  Peggy Richardson is an 26 y.o., female DOB:  1986/03/13  Date of Admission:  08-26-11  Date of Discharge: 09-13-11   Reason for Admission:Depression and anxiety  Hospital Course:Pt started IOP and was continued on her home meds of Imipramine 150 mg q hs ,and abilify 5 mg q hs and her Xanax was changed from 2-3 mg prn to I mg po q hs. Pt did well in   G roups and stabilized rapidly, her sleep and appetite improved and her mood brightened. She was tolerating her meds well and coping well.  Mental Status at Discharge:  Alert/O/3. Mood-good, No SI/ HI. No Hallucinations /Delusions.  R/R memory-good, judgement /insight-good, cncentration / recall-good. Suicidal Risk-minimal.  Lab Results: No results found for this or any previous visit (from the past 48 hour(s)).  Current outpatient prescriptions:ALPRAZolam (XANAX) 1 MG tablet, Take 1 mg by mouth 3 (three) times daily as needed. anxiety , Disp: , Rfl: ;  ARIPiprazole (ABILIFY PO), Take 1 tablet by mouth daily. , Disp: , Rfl: ;  diazepam (VALIUM) 5 MG tablet, Take 5 mg by mouth every 6 (six) hours as needed., Disp: , Rfl: ;  ibuprofen (ADVIL,MOTRIN) 600 MG tablet, Take 600 mg by mouth every 6 (six) hours as needed. pain, Disp: , Rfl:  imipramine (TOFRANIL) 50 MG tablet, Take 150 mg by mouth at bedtime.  , Disp: , Rfl: ;  oxyCODONE-acetaminophen (PERCOCET) 5-325 MG per tablet, Take 1 tablet by mouth every 4 (four) hours as needed., Disp: , Rfl:   Axis Diagnosis:   Axis I: Major Depression, Recurrent severe            Anxiety disorder NOS. Axis II: Deferred Axis III:  Past Medical History  Diagnosis Date  . Anxiety   . Depression   . Fatigue    Axis IV: economic problems, other psychosocial or environmental problems, problems related to social environment and problems with primary support group Axis V: 61-70 mild  symptoms   Level of Care:  OP  Discharge destination:  Home  Is patient on multiple antipsychotic therapies at discharge:  No    Has Patient had three or more failed trials of antipsychotic monotherapy by history:  No  Patient phone:  [phone] (home)  Patient address:   [address] [place] [address],   Follow-up recommendations:  Activity:  as tolerated Diet:  regular  Comments:  Follow up with Dr Milagros Evener for meds and Skeet Simmer for therapy  The patient received suicide prevention pamphlet:  Yes Belongings returned:  Rudi Coco 09-13-11. 10.00pm

## 2011-09-20 ENCOUNTER — Other Ambulatory Visit (HOSPITAL_COMMUNITY): Payer: BC Managed Care – PPO

## 2011-09-23 ENCOUNTER — Other Ambulatory Visit (HOSPITAL_COMMUNITY): Payer: BC Managed Care – PPO

## 2011-09-24 ENCOUNTER — Other Ambulatory Visit (HOSPITAL_COMMUNITY): Payer: BC Managed Care – PPO

## 2011-09-25 ENCOUNTER — Other Ambulatory Visit (HOSPITAL_COMMUNITY): Payer: BC Managed Care – PPO

## 2011-09-27 ENCOUNTER — Other Ambulatory Visit (HOSPITAL_COMMUNITY): Payer: BC Managed Care – PPO

## 2011-09-30 ENCOUNTER — Other Ambulatory Visit (HOSPITAL_COMMUNITY): Payer: BC Managed Care – PPO
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
## 2016-08-20 DIAGNOSIS — L989 Disorder of the skin and subcutaneous tissue, unspecified: Secondary | ICD-10-CM | POA: Diagnosis not present

## 2016-08-20 DIAGNOSIS — M549 Dorsalgia, unspecified: Secondary | ICD-10-CM | POA: Diagnosis not present

## 2016-08-20 DIAGNOSIS — L309 Dermatitis, unspecified: Secondary | ICD-10-CM | POA: Diagnosis not present

## 2016-09-04 DIAGNOSIS — D2339 Other benign neoplasm of skin of other parts of face: Secondary | ICD-10-CM | POA: Diagnosis not present

## 2016-09-25 DIAGNOSIS — D1801 Hemangioma of skin and subcutaneous tissue: Secondary | ICD-10-CM | POA: Diagnosis not present

## 2016-10-02 DIAGNOSIS — Z13220 Encounter for screening for lipoid disorders: Secondary | ICD-10-CM | POA: Diagnosis not present

## 2016-10-02 DIAGNOSIS — Z23 Encounter for immunization: Secondary | ICD-10-CM | POA: Diagnosis not present

## 2016-10-02 DIAGNOSIS — Z0000 Encounter for general adult medical examination without abnormal findings: Secondary | ICD-10-CM | POA: Diagnosis not present

## 2016-10-07 DIAGNOSIS — M545 Low back pain: Secondary | ICD-10-CM | POA: Diagnosis not present

## 2016-10-07 DIAGNOSIS — G8929 Other chronic pain: Secondary | ICD-10-CM | POA: Diagnosis not present

## 2017-09-22 DIAGNOSIS — Z01419 Encounter for gynecological examination (general) (routine) without abnormal findings: Secondary | ICD-10-CM | POA: Diagnosis not present

## 2017-09-22 DIAGNOSIS — Z1159 Encounter for screening for other viral diseases: Secondary | ICD-10-CM | POA: Diagnosis not present

## 2017-09-22 DIAGNOSIS — R5383 Other fatigue: Secondary | ICD-10-CM | POA: Diagnosis not present

## 2017-09-22 DIAGNOSIS — Z118 Encounter for screening for other infectious and parasitic diseases: Secondary | ICD-10-CM | POA: Diagnosis not present

## 2018-01-20 DIAGNOSIS — N760 Acute vaginitis: Secondary | ICD-10-CM | POA: Diagnosis not present

## 2018-01-20 DIAGNOSIS — N920 Excessive and frequent menstruation with regular cycle: Secondary | ICD-10-CM | POA: Diagnosis not present

## 2018-01-20 DIAGNOSIS — Z3200 Encounter for pregnancy test, result unknown: Secondary | ICD-10-CM | POA: Diagnosis not present

## 2018-01-20 DIAGNOSIS — B760 Ancylostomiasis: Secondary | ICD-10-CM | POA: Diagnosis not present

## 2018-05-18 DIAGNOSIS — S62308A Unspecified fracture of other metacarpal bone, initial encounter for closed fracture: Secondary | ICD-10-CM | POA: Diagnosis not present

## 2018-05-21 DIAGNOSIS — S63634A Sprain of interphalangeal joint of right ring finger, initial encounter: Secondary | ICD-10-CM | POA: Diagnosis not present

## 2018-05-26 DIAGNOSIS — D259 Leiomyoma of uterus, unspecified: Secondary | ICD-10-CM | POA: Diagnosis not present

## 2018-05-26 DIAGNOSIS — N920 Excessive and frequent menstruation with regular cycle: Secondary | ICD-10-CM | POA: Diagnosis not present

## 2018-06-15 ENCOUNTER — Other Ambulatory Visit: Payer: Self-pay

## 2018-06-15 ENCOUNTER — Encounter (HOSPITAL_COMMUNITY): Payer: Self-pay | Admitting: *Deleted

## 2018-06-16 ENCOUNTER — Other Ambulatory Visit: Payer: Self-pay | Admitting: Obstetrics

## 2018-06-30 MED ORDER — SODIUM CHLORIDE 0.9 % IV SOLN
100.0000 mg | Freq: Two times a day (BID) | INTRAVENOUS | Status: DC
  Filled 2018-06-30 (×3): qty 100

## 2018-06-30 MED ORDER — DOXYCYCLINE HYCLATE 100 MG IV SOLR
100.0000 mg | Freq: Once | INTRAVENOUS | Status: AC
  Administered 2018-07-01: 100 mg via INTRAVENOUS
  Filled 2018-06-30: qty 100

## 2018-07-01 ENCOUNTER — Encounter (HOSPITAL_COMMUNITY): Payer: Self-pay | Admitting: Emergency Medicine

## 2018-07-01 ENCOUNTER — Encounter (HOSPITAL_COMMUNITY)
Admission: RE | Disposition: A | Discharge status: Home / Self Care | Payer: Self-pay | Source: Home / Self Care | Attending: Obstetrics

## 2018-07-01 ENCOUNTER — Ambulatory Visit (HOSPITAL_COMMUNITY): Payer: 59 | Admitting: Certified Registered Nurse Anesthetist

## 2018-07-01 ENCOUNTER — Ambulatory Visit (HOSPITAL_COMMUNITY)
Admission: RE | Admit: 2018-07-01 | Discharge: 2018-07-01 | Disposition: A | Discharge status: Home / Self Care | Payer: 59 | Attending: Obstetrics | Admitting: Obstetrics

## 2018-07-01 ENCOUNTER — Other Ambulatory Visit: Payer: Self-pay

## 2018-07-01 DIAGNOSIS — N920 Excessive and frequent menstruation with regular cycle: Secondary | ICD-10-CM | POA: Insufficient documentation

## 2018-07-01 DIAGNOSIS — D259 Leiomyoma of uterus, unspecified: Secondary | ICD-10-CM | POA: Diagnosis present

## 2018-07-01 DIAGNOSIS — Z793 Long term (current) use of hormonal contraceptives: Secondary | ICD-10-CM | POA: Diagnosis not present

## 2018-07-01 DIAGNOSIS — Z87891 Personal history of nicotine dependence: Secondary | ICD-10-CM | POA: Insufficient documentation

## 2018-07-01 DIAGNOSIS — D250 Submucous leiomyoma of uterus: Secondary | ICD-10-CM | POA: Insufficient documentation

## 2018-07-01 HISTORY — PX: DILATATION & CURETTAGE/HYSTEROSCOPY WITH MYOSURE: SHX6511

## 2018-07-01 HISTORY — DX: Personal history of other medical treatment: Z92.89

## 2018-07-01 LAB — PREGNANCY, URINE: Preg Test, Ur: NEGATIVE

## 2018-07-01 LAB — CBC
HEMATOCRIT: 37 % (ref 36.0–46.0)
Hemoglobin: 12.2 g/dL (ref 12.0–15.0)
MCH: 31.5 pg (ref 26.0–34.0)
MCHC: 33 g/dL (ref 30.0–36.0)
MCV: 95.6 fL (ref 80.0–100.0)
Platelets: 328 10*3/uL (ref 150–400)
RBC: 3.87 MIL/uL (ref 3.87–5.11)
RDW: 12.9 % (ref 11.5–15.5)
WBC: 7 10*3/uL (ref 4.0–10.5)
nRBC: 0 % (ref 0.0–0.2)

## 2018-07-01 LAB — ABO/RH: ABO/RH(D): B POS

## 2018-07-01 LAB — TYPE AND SCREEN
ABO/RH(D): B POS
Antibody Screen: NEGATIVE

## 2018-07-01 SURGERY — DILATATION & CURETTAGE/HYSTEROSCOPY WITH MYOSURE
Anesthesia: General

## 2018-07-01 MED ORDER — LIDOCAINE HCL (CARDIAC) PF 100 MG/5ML IV SOSY
PREFILLED_SYRINGE | INTRAVENOUS | Status: DC | PRN
  Administered 2018-07-01: 100 mg via INTRAVENOUS

## 2018-07-01 MED ORDER — VASOPRESSIN 20 UNIT/ML IV SOLN
INTRAVENOUS | Status: DC | PRN
  Administered 2018-07-01: 20 [IU]

## 2018-07-01 MED ORDER — DEXAMETHASONE SODIUM PHOSPHATE 10 MG/ML IJ SOLN
INTRAMUSCULAR | Status: DC | PRN
  Administered 2018-07-01: 4 mg via INTRAVENOUS

## 2018-07-01 MED ORDER — HYDROMORPHONE HCL 1 MG/ML IJ SOLN
INTRAMUSCULAR | Status: AC
  Filled 2018-07-01: qty 1

## 2018-07-01 MED ORDER — FENTANYL CITRATE (PF) 100 MCG/2ML IJ SOLN
INTRAMUSCULAR | Status: DC | PRN
  Administered 2018-07-01: 50 ug via INTRAVENOUS
  Administered 2018-07-01 (×3): 25 ug via INTRAVENOUS
  Administered 2018-07-01: 50 ug via INTRAVENOUS
  Administered 2018-07-01: 25 ug via INTRAVENOUS

## 2018-07-01 MED ORDER — FENTANYL CITRATE (PF) 100 MCG/2ML IJ SOLN
INTRAMUSCULAR | Status: AC
  Filled 2018-07-01: qty 2

## 2018-07-01 MED ORDER — MEDROXYPROGESTERONE ACETATE 10 MG PO TABS: 10.0000 mg | ORAL_TABLET | Freq: Every day | ORAL | 0 refills | Status: AC

## 2018-07-01 MED ORDER — PROPOFOL 10 MG/ML IV BOLUS
INTRAVENOUS | Status: DC | PRN
  Administered 2018-07-01: 200 mg via INTRAVENOUS

## 2018-07-01 MED ORDER — BUPIVACAINE HCL 0.5 % IJ SOLN
INTRAMUSCULAR | Status: DC | PRN
  Administered 2018-07-01: 30 mL

## 2018-07-01 MED ORDER — MEPERIDINE HCL 25 MG/ML IJ SOLN: 6.2500 mg | INTRAMUSCULAR | Status: DC | PRN

## 2018-07-01 MED ORDER — ONDANSETRON HCL 4 MG/2ML IJ SOLN
INTRAMUSCULAR | Status: DC | PRN
  Administered 2018-07-01: 4 mg via INTRAVENOUS

## 2018-07-01 MED ORDER — BUPIVACAINE HCL (PF) 0.5 % IJ SOLN
INTRAMUSCULAR | Status: AC
  Filled 2018-07-01: qty 30

## 2018-07-01 MED ORDER — DEXAMETHASONE SODIUM PHOSPHATE 4 MG/ML IJ SOLN
INTRAMUSCULAR | Status: AC
  Filled 2018-07-01: qty 1

## 2018-07-01 MED ORDER — OXYCODONE HCL 5 MG/5ML PO SOLN: 5.0000 mg | Freq: Once | ORAL | Status: DC | PRN

## 2018-07-01 MED ORDER — LIDOCAINE HCL (CARDIAC) PF 100 MG/5ML IV SOSY
PREFILLED_SYRINGE | INTRAVENOUS | Status: AC
  Filled 2018-07-01: qty 5

## 2018-07-01 MED ORDER — OXYCODONE HCL 5 MG PO TABS: 5.0000 mg | ORAL_TABLET | Freq: Once | ORAL | Status: DC | PRN

## 2018-07-01 MED ORDER — VASOPRESSIN 20 UNIT/ML IV SOLN
INTRAVENOUS | Status: AC
  Filled 2018-07-01: qty 1

## 2018-07-01 MED ORDER — PROMETHAZINE HCL 25 MG/ML IJ SOLN: 6.2500 mg | INTRAMUSCULAR | Status: DC | PRN

## 2018-07-01 MED ORDER — SODIUM CHLORIDE 0.9 % IR SOLN
Status: DC | PRN
  Administered 2018-07-01: 3000 mL

## 2018-07-01 MED ORDER — MIDAZOLAM HCL 2 MG/2ML IJ SOLN
INTRAMUSCULAR | Status: AC
  Filled 2018-07-01: qty 2

## 2018-07-01 MED ORDER — PROPOFOL 10 MG/ML IV BOLUS
INTRAVENOUS | Status: AC
  Filled 2018-07-01: qty 20

## 2018-07-01 MED ORDER — HYDROMORPHONE HCL 1 MG/ML IJ SOLN
0.2500 mg | INTRAMUSCULAR | Status: DC | PRN
  Administered 2018-07-01 (×2): 0.25 mg via INTRAVENOUS

## 2018-07-01 MED ORDER — SCOPOLAMINE 1 MG/3DAYS TD PT72
MEDICATED_PATCH | TRANSDERMAL | Status: AC
  Administered 2018-07-01: 1.5 mg via TRANSDERMAL
  Filled 2018-07-01: qty 1

## 2018-07-01 MED ORDER — MIDAZOLAM HCL 2 MG/2ML IJ SOLN
INTRAMUSCULAR | Status: DC | PRN
  Administered 2018-07-01: 2 mg via INTRAVENOUS

## 2018-07-01 MED ORDER — ESTRADIOL 1 MG PO TABS: 2.0000 mg | ORAL_TABLET | Freq: Every day | ORAL | 0 refills | Status: AC

## 2018-07-01 MED ORDER — KETOROLAC TROMETHAMINE 30 MG/ML IJ SOLN
INTRAMUSCULAR | Status: AC
  Filled 2018-07-01: qty 1

## 2018-07-01 MED ORDER — SCOPOLAMINE 1 MG/3DAYS TD PT72
1.0000 | MEDICATED_PATCH | Freq: Once | TRANSDERMAL | Status: DC
  Administered 2018-07-01: 1.5 mg via TRANSDERMAL

## 2018-07-01 MED ORDER — OXYCODONE-ACETAMINOPHEN 5-325 MG PO TABS: 1.0000 | ORAL_TABLET | ORAL | 0 refills | Status: AC | PRN

## 2018-07-01 MED ORDER — LACTATED RINGERS IV SOLN
INTRAVENOUS | Status: DC
  Administered 2018-07-01 (×2): via INTRAVENOUS

## 2018-07-01 MED ORDER — CHLOROPROCAINE HCL 1 % IJ SOLN
INTRAMUSCULAR | Status: AC
  Filled 2018-07-01: qty 30

## 2018-07-01 MED ORDER — ONDANSETRON HCL 4 MG/2ML IJ SOLN
INTRAMUSCULAR | Status: AC
  Filled 2018-07-01: qty 2

## 2018-07-01 MED ORDER — KETOROLAC TROMETHAMINE 30 MG/ML IJ SOLN
INTRAMUSCULAR | Status: DC | PRN
  Administered 2018-07-01: 30 mg via INTRAVENOUS

## 2018-07-01 SURGICAL SUPPLY — 13 items
CATH ROBINSON RED A/P 16FR (CATHETERS) ×2 IMPLANT
DEVICE MYOSURE REACH (MISCELLANEOUS) IMPLANT
GLOVE BIO SURGEON STRL SZ 6.5 (GLOVE) ×2 IMPLANT
GLOVE BIOGEL PI IND STRL 7.0 (GLOVE) ×2 IMPLANT
GLOVE BIOGEL PI INDICATOR 7.0 (GLOVE) ×2
GOWN STRL REUS W/TWL LRG LVL3 (GOWN DISPOSABLE) ×4 IMPLANT
KIT PROCEDURE FLUENT (KITS) ×2 IMPLANT
MYOSURE XL FIBROID (MISCELLANEOUS) ×2
PACK VAGINAL MINOR WOMEN LF (CUSTOM PROCEDURE TRAY) ×2 IMPLANT
PAD OB MATERNITY 4.3X12.25 (PERSONAL CARE ITEMS) ×2 IMPLANT
SEAL ROD LENS SCOPE MYOSURE (ABLATOR) ×2 IMPLANT
SYSTEM TISS REMOVAL MYOSURE XL (MISCELLANEOUS) ×1 IMPLANT
TOWEL OR 17X24 6PK STRL BLUE (TOWEL DISPOSABLE) ×4 IMPLANT

## 2018-07-01 NOTE — Transfer of Care (Signed)
Immediate Anesthesia Transfer of Care Note  Patient: Peggy Richardson  Procedure(s) Performed: DILATATION & CURETTAGE/HYSTEROSCOPY WITH MYOSURE (N/A )  Patient Location: PACU  Anesthesia Type:General  Level of Consciousness: awake, alert  and oriented  Airway & Oxygen Therapy: Patient Spontanous Breathing and Patient connected to nasal cannula oxygen  Post-op Assessment: Report given to RN and Post -op Vital signs reviewed and stable  Post vital signs: Reviewed and stable  Last Vitals:  Vitals Value Taken Time  BP 145/97 07/01/2018  2:03 PM  Temp    Pulse 101 07/01/2018  2:06 PM  Resp 16 07/01/2018  2:06 PM  SpO2 100 % 07/01/2018  2:06 PM  Vitals shown include unvalidated device data.  Last Pain:  Vitals:   06/15/18 1617  TempSrc: Oral      Patients Stated Pain Goal: 3 (64/38/38 1840)  Complications: No apparent anesthesia complications

## 2018-07-01 NOTE — Op Note (Signed)
07/01/2018  1:58 PM  PATIENT:  Peggy Richardson  32 y.o. female  PRE-OPERATIVE DIAGNOSIS:  Uterine Fibroids, Menorrhagia  POST-OPERATIVE DIAGNOSIS:  Uterine Fibroids, Menorrhagia  PROCEDURE:  Procedure(s): DILATATION & CURETTAGE/HYSTEROSCOPY WITH MYOSURE (N/A)  SURGEON:  Surgeon(s) and Role:    Aloha Gell, MD - Primary  PHYSICIAN ASSISTANT: none  ASSISTANTS: none   ANESTHESIA:   local and general  EBL:  25 mL   BLOOD ADMINISTERED:none  DRAINS: none   LOCAL MEDICATIONS USED:  MARCAINE   , Amount: 10 ml and OTHER mixed with vasopressin (20 units/ 30cc 1/2% marcaine)  SPECIMEN:  Source of Specimen:  submucosal fibroid  DISPOSITION OF SPECIMEN:  PATHOLOGY  COUNTS:  YES  TOURNIQUET:  * No tourniquets in log *  DICTATION: .Note written in EPIC  PLAN OF CARE: Discharge to home after PACU  PATIENT DISPOSITION:  PACU - hemodynamically stable.   Delay start of Pharmacological VTE agent (>24hrs) due to surgical blood loss or risk of bleeding: yes  Indications: menorrhagia, SM fibroid  Abx: 100mg  IV doxycycline   After informed consent including discussion of risks of bleeding, infection, failure to achieve successful pregnancy postoperatively, IU adhesions, the patient was taken to the operating room where general anesthesia was initiated without difficulty. She was prepped and draped in normal sterile fashion in the dorsal supine lithotomy position. A straight cath was done.   A bimanual examination was done to assess the size and position of the uterus. A speculum was placed in the vagina, ant lip grasped with single tooth tenaculum an dcvx dilated to #19 Kennon Rounds. The hysteroscope, myosure was inserted into the uterus. Right ostia easily seen, L ostia hidden behind pathology- though was seen at the end of the case. Uterine walls thin, large fibroid seen, all intracavitary. The myosure reach was inserted through the operating channel but after several minutes failed to  achieve any significant removal of fibroid. The XL myosure was then inserted and about 30 minutes were spent in morecellation. All the fibroid was removed.   No active bleeding noted. The hysteroscope was then removed. Tenaculum was removed. The tenaculum site was hemostatic and the case was terminated.  Pt tolerated well.  [name] 07/01/2018 2:04 PM

## 2018-07-01 NOTE — Anesthesia Postprocedure Evaluation (Signed)
Anesthesia Post Note  Patient: Yaffa Seckman  Procedure(s) Performed: DILATATION & CURETTAGE/HYSTEROSCOPY WITH MYOSURE (N/A )     Patient location during evaluation: PACU Anesthesia Type: General Level of consciousness: awake and alert Pain management: pain level controlled Vital Signs Assessment: post-procedure vital signs reviewed and stable Respiratory status: spontaneous breathing, nonlabored ventilation and respiratory function stable Cardiovascular status: blood pressure returned to baseline and stable Postop Assessment: no apparent nausea or vomiting Anesthetic complications: no    Last Vitals:  Vitals:   07/01/18 1445 07/01/18 1500  BP: (!) 137/92 (!) 130/95  Pulse: 85 84  Resp: (!) 21 20  Temp:    SpO2: 99% 100%    Last Pain:  Vitals:   07/01/18 1500  TempSrc:   PainSc: 3    Pain Goal: Patients Stated Pain Goal: 3 (07/01/18 1500)               Lynda Rainwater

## 2018-07-01 NOTE — Brief Op Note (Signed)
07/01/2018  1:58 PM  PATIENT:  Peggy Richardson  32 y.o. female  PRE-OPERATIVE DIAGNOSIS:  Uterine Fibroids, Menorrhagia  POST-OPERATIVE DIAGNOSIS:  Uterine Fibroids, Menorrhagia  PROCEDURE:  Procedure(s): DILATATION & CURETTAGE/HYSTEROSCOPY WITH MYOSURE (N/A)  SURGEON:  Surgeon(s) and Role:    Aloha Gell, MD - Primary  PHYSICIAN ASSISTANT: none  ASSISTANTS: none   ANESTHESIA:   local and general  EBL:  25 mL   BLOOD ADMINISTERED:none  DRAINS: none   LOCAL MEDICATIONS USED:  MARCAINE   , Amount: 10 ml and OTHER mixed with vasopressin (20 units/ 30cc 1/2% marcaine)  SPECIMEN:  Source of Specimen:  submucosal fibroid  DISPOSITION OF SPECIMEN:  PATHOLOGY  COUNTS:  YES  TOURNIQUET:  * No tourniquets in log *  DICTATION: .Note written in EPIC  PLAN OF CARE: Discharge to home after PACU  PATIENT DISPOSITION:  PACU - hemodynamically stable.   Delay start of Pharmacological VTE agent (>24hrs) due to surgical blood loss or risk of bleeding: yes

## 2018-07-01 NOTE — H&P (Signed)
CC: menorrhagia, fibroids  HPI: 32 yo G1P0010 with known fibroids with worsening bleeding despite OCs, over the past few months. SIS reveals 3 cm cavitary fibroid.   Past Medical History:  Diagnosis Date  . Anxiety    no med  . Depression    no meds  . Fatigue   . History of blood transfusion    patient donated her own blood for the transfusion    Past Surgical History:  Procedure Laterality Date  . SPINAL FUSION      NKDA  PE: Vitals:   06/15/18 1617 07/01/18 1150  BP: (!) 129/95   Pulse: 74   Resp: 18   Temp: 98.2 F (36.8 C)   TempSrc: Oral   SpO2: 100%   Weight: 103.4 kg 103.4 kg  Height: 5\' 5"  (1.651 m) 5\' 5"  (1.651 m)   Gen: slightly anxious Abd: soft, NT, ND LE: NT, SCDs in place GU: def to OR Skin: warm, dry  CBC    Component Value Date/Time   WBC 7.0 07/01/2018 1124   RBC 3.87 07/01/2018 1124   HGB 12.2 07/01/2018 1124   HCT 37.0 07/01/2018 1124   PLT 328 07/01/2018 1124   MCV 95.6 07/01/2018 1124   MCH 31.5 07/01/2018 1124   MCHC 33.0 07/01/2018 1124   RDW 12.9 07/01/2018 1124    A/p: hysteroscopic fibroid resection. R/B d/w pt. OK for blood tx if needed though not expected. Doxy prophylaxis for future fertility plans  [name] 07/01/2018 12:38 PM

## 2018-07-01 NOTE — Anesthesia Procedure Notes (Signed)
Procedure Name: LMA Insertion Date/Time: 07/01/2018 12:50 PM Performed by: Bufford Spikes, CRNA Pre-anesthesia Checklist: Patient identified, Emergency Drugs available, Suction available and Patient being monitored Patient Re-evaluated:Patient Re-evaluated prior to induction Oxygen Delivery Method: Circle system utilized Preoxygenation: Pre-oxygenation with 100% oxygen Induction Type: IV induction Ventilation: Mask ventilation without difficulty LMA: LMA inserted LMA Size: 4.0 Number of attempts: 1 Placement Confirmation: positive ETCO2 Tube secured with: Tape Dental Injury: Teeth and Oropharynx as per pre-operative assessment

## 2018-07-01 NOTE — Discharge Instructions (Signed)
DISCHARGE INSTRUCTIONS: D&C The following instructions have been prepared to help you care for yourself upon your return home.   Personal hygiene:  Use sanitary pads for vaginal drainage, not tampons.  Shower the day after your procedure.  NO tub baths, pools or Jacuzzis for 2-3 weeks.  Activity and limitations:  Do NOT rest in bed all day.  Walking is encouraged.  Walk up and down stairs slowly.  You may resume your normal activity in one to two days or as indicated by your physician.  Sexual activity: NO intercourse for at least 2 weeks after the procedure, or as indicated by your physician.  Return to work: You may resume your work activities in one to two days or as indicated by your doctor.  What to expect after your surgery: Expect to have vaginal bleeding/discharge for 2-3 days and spotting for up to 10 days. It is not unusual to have soreness for up to 1-2 weeks. You may have a slight burning sensation when you urinate for the first day. Mild cramps may continue for a couple of days. You may have a regular period in 2-6 weeks.  Call your doctor for any of the following:  Excessive vaginal bleeding, saturating and changing one pad every hour.  Inability to urinate 6 hours after discharge from hospital.  Pain not relieved by pain medication.  Fever of 100.4 F or greater.  Unusual vaginal discharge or odor.   Post Anesthesia Home Care Instructions  Activity: Get plenty of rest for the remainder of the day. A responsible individual must stay with you for 24 hours following the procedure.  For the next 24 hours, DO NOT: -Drive a car -Paediatric nurse -Drink alcoholic beverages -Take any medication unless instructed by your physician -Make any legal decisions or sign important papers.  Meals: Start with liquid foods such as gelatin or soup. Progress to regular foods as tolerated. Avoid greasy, spicy, heavy foods. If nausea and/or vomiting occur, drink only  clear liquids until the nausea and/or vomiting subsides. Call your physician if vomiting continues.  Special Instructions/Symptoms: Your throat may feel dry or sore from the anesthesia or the breathing tube placed in your throat during surgery. If this causes discomfort, gargle with warm salt water. The discomfort should disappear within 24 hours.  If you had a scopolamine patch placed behind your ear for the management of post- operative nausea and/or vomiting:  1. The medication in the patch is effective for 72 hours, after which it should be removed.  Wrap patch in a tissue and discard in the trash. Wash hands thoroughly with soap and water. 2. You may remove the patch earlier than 72 hours if you experience unpleasant side effects which may include dry mouth, dizziness or visual disturbances. 3. Avoid touching the patch. Wash your hands with soap and water after contact with the patch.   **You may remove your patch on or before Saturday, July 04, 2018**

## 2018-07-01 NOTE — Anesthesia Preprocedure Evaluation (Signed)
Anesthesia Evaluation  Patient identified by MRN, date of birth, ID band Patient awake    Reviewed: Allergy & Precautions, NPO status , Patient's Chart, lab work & pertinent test results  Airway Mallampati: II  TM Distance: >3 FB Neck ROM: Full    Dental no notable dental hx.    Pulmonary neg pulmonary ROS, former smoker,    Pulmonary exam normal breath sounds clear to auscultation       Cardiovascular negative cardio ROS Normal cardiovascular exam Rhythm:Regular Rate:Normal     Neuro/Psych Anxiety Depression negative neurological ROS  negative psych ROS   GI/Hepatic negative GI ROS, Neg liver ROS,   Endo/Other  negative endocrine ROS  Renal/GU negative Renal ROS  negative genitourinary   Musculoskeletal negative musculoskeletal ROS (+)   Abdominal (+) + obese,   Peds negative pediatric ROS (+)  Hematology negative hematology ROS (+)   Anesthesia Other Findings   Reproductive/Obstetrics negative OB ROS                             Anesthesia Physical Anesthesia Plan  ASA: II  Anesthesia Plan: General   Post-op Pain Management:    Induction: Intravenous  PONV Risk Score and Plan: 3 and Ondansetron, Dexamethasone and Midazolam  Airway Management Planned: LMA  Additional Equipment:   Intra-op Plan:   Post-operative Plan: Extubation in OR  Informed Consent: I have reviewed the patients History and Physical, chart, labs and discussed the procedure including the risks, benefits and alternatives for the proposed anesthesia with the patient or authorized representative who has indicated his/her understanding and acceptance.   Dental advisory given  Plan Discussed with: CRNA  Anesthesia Plan Comments:         Anesthesia Quick Evaluation

## 2018-07-02 ENCOUNTER — Other Ambulatory Visit: Payer: Self-pay | Admitting: Obstetrics

## 2018-07-02 ENCOUNTER — Encounter (HOSPITAL_COMMUNITY): Payer: Self-pay | Admitting: Obstetrics
